# Patient Record
Sex: Male | Born: 2000
Health system: Southern US, Community
[De-identification: ages and names within clinical notes are randomized; demographics above are authoritative.]

## PROBLEM LIST (undated history)

## (undated) DIAGNOSIS — F909 Attention-deficit hyperactivity disorder, unspecified type: Secondary | ICD-10-CM

## (undated) DIAGNOSIS — J45909 Unspecified asthma, uncomplicated: Secondary | ICD-10-CM

## (undated) DIAGNOSIS — H7090 Unspecified mastoiditis, unspecified ear: Secondary | ICD-10-CM

## (undated) HISTORY — PX: TYMPANOSTOMY TUBE PLACEMENT: SHX32

## (undated) HISTORY — DX: Unspecified mastoiditis, unspecified ear: H70.90

---
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
## 2020-01-15 ENCOUNTER — Ambulatory Visit: Payer: Self-pay

## 2020-08-03 ENCOUNTER — Emergency Department (HOSPITAL_COMMUNITY)
Admission: EM | Admit: 2020-08-03 | Discharge: 2020-08-03 | Disposition: A | Discharge status: Home / Self Care | Payer: 59 | Attending: Emergency Medicine | Admitting: Emergency Medicine

## 2020-08-03 ENCOUNTER — Encounter (HOSPITAL_COMMUNITY): Payer: Self-pay | Admitting: Emergency Medicine

## 2020-08-03 ENCOUNTER — Other Ambulatory Visit: Payer: Self-pay

## 2020-08-03 ENCOUNTER — Emergency Department (HOSPITAL_COMMUNITY): Payer: 59

## 2020-08-03 DIAGNOSIS — R519 Headache, unspecified: Secondary | ICD-10-CM | POA: Diagnosis present

## 2020-08-03 DIAGNOSIS — J45909 Unspecified asthma, uncomplicated: Secondary | ICD-10-CM | POA: Diagnosis not present

## 2020-08-03 DIAGNOSIS — Z87891 Personal history of nicotine dependence: Secondary | ICD-10-CM | POA: Diagnosis not present

## 2020-08-03 MED ORDER — ACETAMINOPHEN 500 MG PO TABS
1000.0000 mg | ORAL_TABLET | Freq: Once | ORAL | Status: AC
  Administered 2020-08-03: 1000 mg via ORAL
  Filled 2020-08-03: qty 2

## 2020-08-03 MED ORDER — PROCHLORPERAZINE MALEATE 5 MG PO TABS
10.0000 mg | ORAL_TABLET | Freq: Four times a day (QID) | ORAL | Status: DC | PRN
  Administered 2020-08-03: 10 mg via ORAL
  Filled 2020-08-03: qty 2

## 2020-08-03 MED ORDER — CEFDINIR 300 MG PO CAPS: 300.0000 mg | ORAL_CAPSULE | Freq: Two times a day (BID) | ORAL | 0 refills | Status: AC

## 2020-08-03 NOTE — ED Triage Notes (Signed)
Pt states he was smoking weed with a friend when he felt something pop in his head. Pt states he can feel the blood running through his head and his hands. Pt states he has been in and out of the heat today. Pt very agitated. Demanding we get doctor in room ASAP. Keeps asking this nurse to "feel his head so we can feel the blood running through it".

## 2020-08-03 NOTE — Discharge Instructions (Addendum)
You were seen in the ER today for your headache.  Your physical exam, vital signs, and CT of the head were very reassuring.  You may use Tylenol and ibuprofen as needed for your headache.  Encourage rest for the next couple days.  Additionally regarding right ear pain you have been prescribed an antibiotic to treat your suspected ear infection.  Please take this as prescribed for the entire course.  Return to the ER if you develop any new blurry vision, double vision, nausea or vomiting does not stop, or any other new severe symptoms.
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
## 2020-12-19 ENCOUNTER — Emergency Department (HOSPITAL_COMMUNITY)
Admission: EM | Admit: 2020-12-19 | Discharge: 2020-12-20 | Disposition: A | Discharge status: Home / Self Care | Payer: 59 | Attending: Emergency Medicine | Admitting: Emergency Medicine

## 2020-12-19 ENCOUNTER — Encounter (HOSPITAL_COMMUNITY): Payer: Self-pay

## 2020-12-19 ENCOUNTER — Other Ambulatory Visit: Payer: Self-pay

## 2020-12-19 DIAGNOSIS — M79631 Pain in right forearm: Secondary | ICD-10-CM | POA: Diagnosis not present

## 2020-12-19 DIAGNOSIS — Z5321 Procedure and treatment not carried out due to patient leaving prior to being seen by health care provider: Secondary | ICD-10-CM | POA: Insufficient documentation

## 2020-12-19 DIAGNOSIS — Y92410 Unspecified street and highway as the place of occurrence of the external cause: Secondary | ICD-10-CM | POA: Diagnosis not present

## 2020-12-19 DIAGNOSIS — R519 Headache, unspecified: Secondary | ICD-10-CM | POA: Insufficient documentation
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
## 2020-12-30 ENCOUNTER — Other Ambulatory Visit: Payer: Self-pay

## 2020-12-30 ENCOUNTER — Ambulatory Visit
Admission: EM | Admit: 2020-12-30 | Discharge: 2020-12-30 | Disposition: A | Discharge status: Home / Self Care | Payer: 59 | Attending: Physician Assistant | Admitting: Physician Assistant

## 2020-12-30 DIAGNOSIS — J45901 Unspecified asthma with (acute) exacerbation: Secondary | ICD-10-CM | POA: Diagnosis not present

## 2020-12-30 DIAGNOSIS — Z20828 Contact with and (suspected) exposure to other viral communicable diseases: Secondary | ICD-10-CM | POA: Diagnosis not present

## 2020-12-30 DIAGNOSIS — J019 Acute sinusitis, unspecified: Secondary | ICD-10-CM

## 2020-12-30 MED ORDER — PREDNISONE 10 MG (21) PO TBPK: ORAL_TABLET | Freq: Every day | ORAL | 0 refills | Status: DC

## 2020-12-30 MED ORDER — ALBUTEROL SULFATE HFA 108 (90 BASE) MCG/ACT IN AERS: 1.0000 | INHALATION_SPRAY | Freq: Four times a day (QID) | RESPIRATORY_TRACT | 1 refills | Status: AC | PRN

## 2020-12-30 MED ORDER — DOXYCYCLINE HYCLATE 100 MG PO CAPS: 100.0000 mg | ORAL_CAPSULE | Freq: Two times a day (BID) | ORAL | 0 refills | Status: AC

## 2020-12-30 NOTE — ED Triage Notes (Signed)
Patient states that he is very congested for about 2 weeks. His mom and brother were diagnosed with Flu about a week ago.   He states he took Nyquil and Prednisone from an old prescription for the past 6 days.  He states his throat is hurting bad and coughing up a little blood and it os hard to breath because he asthma. He also states he has had bad headaches.  Denies Fever.

## 2020-12-30 NOTE — ED Provider Notes (Signed)
[facility]    CSN: [id number] Arrival date & time: 12/30/20  1136      History   Chief Complaint No chief complaint on file.   HPI Wayne Rogers is a 20 y.o. male.   Pt complains of cough, congestion, and sinus pressure that started about two weeks ago.  Complains of sore throat.  Denies fever, chills, n/v/d.  He has a h/o asthma and reports some increased wheezing and shortness of breath.  He is using his inhaler with temporary results.  He has been taking Nyquil with no relief.  He has been taking prednisone from an old prescription, unsure of dosage with minimal relief.    Past Medical History:  Diagnosis Date   ADHD (attention deficit hyperactivity disorder)    Asthma    Mastoiditis     Patient Active Problem List   Diagnosis Date Noted   Asthma, chronic 05/08/2012   Multiple food allergies 05/08/2012    Past Surgical History:  Procedure Laterality Date   IRRIGATION AND DEBRIDEMENT ABSCESS  02/02/2012   Procedure: IRRIGATION AND DEBRIDEMENT ABSCESS;  Surgeon: Melissa Montane, MD;  Location: [place];  Service: ENT;  Laterality: Right;   MYRINGOTOMY WITH TUBE PLACEMENT  02/02/2012   Procedure: MYRINGOTOMY WITH TUBE PLACEMENT;  Surgeon: Melissa Montane, MD;  Location: [place];  Service: ENT;  Laterality: Right;   TYMPANOSTOMY TUBE PLACEMENT         Home Medications    Prior to Admission medications   Medication Sig Start Date End Date Taking? Authorizing Provider  albuterol (VENTOLIN HFA) 108 (90 Base) MCG/ACT inhaler Inhale 1-2 puffs into the lungs every 6 (six) hours as needed for wheezing or shortness of breath. 12/30/20  Yes Ward, Lenise Arena, PA-C  doxycycline (VIBRAMYCIN) 100 MG capsule Take 1 capsule (100 mg total) by mouth 2 (two) times daily for 5 days. 12/30/20 01/04/21 Yes Ward, Lenise Arena, PA-C  predniSONE (STERAPRED UNI-PAK 21 TAB) 10 MG (21) TBPK tablet Take by mouth daily. Take 6 tabs by mouth daily  for 2 days, then 5 tabs for 2 days, then 4 tabs for 2 days,  then 3 tabs for 2 days, 2 tabs for 2 days, then 1 tab by mouth daily for 2 days 12/30/20  Yes Ward, Janett Billow Z, PA-C  albuterol (PROVENTIL) (2.5 MG/3ML) 0.083% nebulizer solution Take 3 mLs (2.5 mg total) by nebulization every 4 (four) hours as needed for wheezing or shortness of breath. 12/04/15   Valentina Shaggy, MD  albuterol (VENTOLIN HFA) 108 (90 Base) MCG/ACT inhaler INHALE 2 PUFFS EVERY 4 HOURS AS NEEDED FOR WHEEZING OR SHORTNESS OF BREATH. Patient taking differently: Inhale 2 puffs into the lungs every 4 (four) hours as needed for shortness of breath or wheezing. 10/16/17   Valentina Shaggy, MD  naproxen sodium (ALEVE) 220 MG tablet Take 220 mg by mouth daily as needed (for pain).    [provider]    Family History Family History  Problem Relation Age of Onset   Hypertension Father    Asthma Sister    Asthma Maternal Grandmother    Cancer Maternal Grandmother    Hypertension Maternal Grandfather    Heart disease Maternal Grandfather    Heart disease Paternal Grandfather    Allergic rhinitis Neg Hx    Angioedema Neg Hx    Atopy Neg Hx    Eczema Neg Hx    Immunodeficiency Neg Hx    Urticaria Neg Hx     Social History  Social History   Tobacco Use   Smoking status: Former    Packs/day: 0.50    Types: Cigarettes, E-cigarettes   Smokeless tobacco: Never   Tobacco comments:    mom smokes  Vaping Use   Vaping Use: Every day  Substance Use Topics   Alcohol use: No   Drug use: No     Allergies   Penicillins, Sulfa antibiotics, Other, and Shellfish allergy   Review of Systems Review of Systems  Constitutional:  Negative for chills and fever.  HENT:  Positive for congestion and sinus pressure. Negative for ear pain and sore throat.   Eyes:  Negative for pain and visual disturbance.  Respiratory:  Positive for cough, shortness of breath and wheezing.   Cardiovascular:  Negative for chest pain and palpitations.  Gastrointestinal:  Negative for  abdominal pain and vomiting.  Genitourinary:  Negative for dysuria and hematuria.  Musculoskeletal:  Negative for arthralgias and back pain.  Skin:  Negative for color change and rash.  Neurological:  Negative for seizures and syncope.  All other systems reviewed and are negative.   Physical Exam Triage Vital Signs ED Triage Vitals  Enc Vitals Group     BP 12/30/20 1245 112/74     Pulse Rate 12/30/20 1245 (!) 117     Resp 12/30/20 1245 20     Temp 12/30/20 1245 98.8 F (37.1 C)     Temp Source 12/30/20 1245 Oral     SpO2 12/30/20 1245 91 %     Weight --      Height --      Head Circumference --      Peak Flow --      Pain Score 12/30/20 1242 6     Pain Loc --      Pain Edu? --      Excl. in GC? --    No data found.  Updated Vital Signs BP 112/74 (BP Location: Right Arm)   Pulse (!) 117   Temp 98.8 F (37.1 C) (Oral)   Resp 20   SpO2 91%   Visual Acuity Right Eye Distance:   Left Eye Distance:   Bilateral Distance:    Right Eye Near:   Left Eye Near:    Bilateral Near:     Physical Exam Vitals and nursing note reviewed.  Constitutional:      General: He is not in acute distress.    Appearance: He is well-developed.  HENT:     Head: Normocephalic and atraumatic.  Eyes:     Conjunctiva/sclera: Conjunctivae normal.  Cardiovascular:     Rate and Rhythm: Normal rate and regular rhythm.     Heart sounds: No murmur heard. Pulmonary:     Effort: Pulmonary effort is normal. No respiratory distress.     Breath sounds: Normal breath sounds.  Abdominal:     Palpations: Abdomen is soft.     Tenderness: There is no abdominal tenderness.  Musculoskeletal:        General: No swelling.     Cervical back: Neck supple.  Skin:    General: Skin is warm and dry.     Capillary Refill: Capillary refill takes less than 2 seconds.  Neurological:     Mental Status: He is alert.  Psychiatric:        Mood and Affect: Mood normal.     UC Treatments / Results   Labs (all labs ordered are listed, but only abnormal results are displayed) Labs Reviewed  COVID-19,  FLU A+B NAA    EKG   Radiology No results found.  Procedures Procedures (including critical care time)  Medications Ordered in UC Medications - No data to display  Initial Impression / Assessment and Plan / UC Course  I have reviewed the triage vital signs and the nursing notes.  Pertinent labs & imaging results that were available during my care of the patient were reviewed by me and considered in my medical decision making (see chart for details).     Acute sinusitis, asthma exacerbation.  Antibiotic and prednisone prescribed.  He will continue with inhaler, asked for refill.  Supportive care discussed.  Return precautions discussed.  Final Clinical Impressions(s) / UC Diagnoses   Final diagnoses:  Exposure to the flu  Acute non-recurrent sinusitis, unspecified location  Mild asthma with acute exacerbation, unspecified whether persistent     Discharge Instructions      Take antibiotic as prescribed Take prednisone as prescribed Recommend Flonase and Mucinex Can take Delsym for cough as needed Return if symptoms become worse.      ED Prescriptions     Medication Sig Dispense Auth. Provider   doxycycline (VIBRAMYCIN) 100 MG capsule Take 1 capsule (100 mg total) by mouth 2 (two) times daily for 5 days. 10 capsule Ward, Lenise Arena, PA-C   predniSONE (STERAPRED UNI-PAK 21 TAB) 10 MG (21) TBPK tablet Take by mouth daily. Take 6 tabs by mouth daily  for 2 days, then 5 tabs for 2 days, then 4 tabs for 2 days, then 3 tabs for 2 days, 2 tabs for 2 days, then 1 tab by mouth daily for 2 days 42 tablet Ward, Lenise Arena, PA-C   albuterol (VENTOLIN HFA) 108 (90 Base) MCG/ACT inhaler Inhale 1-2 puffs into the lungs every 6 (six) hours as needed for wheezing or shortness of breath. 1 each Ward, Lenise Arena, PA-C      PDMP not reviewed this encounter.   Ward, Lenise Arena,  PA-C 12/30/20 1319

## 2020-12-30 NOTE — Discharge Instructions (Addendum)
Take antibiotic as prescribed Take prednisone as prescribed Recommend Flonase and Mucinex Can take Delsym for cough as needed Return if symptoms become worse.

## 2020-12-31 LAB — COVID-19, FLU A+B NAA
Influenza A, NAA: NOT DETECTED
Influenza B, NAA: NOT DETECTED
SARS-CoV-2, NAA: NOT DETECTED

## 2021-09-17 ENCOUNTER — Emergency Department (HOSPITAL_COMMUNITY)
Admission: EM | Admit: 2021-09-17 | Discharge: 2021-09-17 | Disposition: A | Discharge status: Home / Self Care | Payer: 59 | Attending: Emergency Medicine | Admitting: Emergency Medicine

## 2021-09-17 ENCOUNTER — Other Ambulatory Visit: Payer: Self-pay

## 2021-09-17 ENCOUNTER — Encounter (HOSPITAL_COMMUNITY): Payer: Self-pay

## 2021-09-17 ENCOUNTER — Emergency Department (HOSPITAL_COMMUNITY): Payer: 59

## 2021-09-17 DIAGNOSIS — S59911A Unspecified injury of right forearm, initial encounter: Secondary | ICD-10-CM | POA: Diagnosis not present

## 2021-09-17 DIAGNOSIS — J45909 Unspecified asthma, uncomplicated: Secondary | ICD-10-CM | POA: Diagnosis not present

## 2021-09-17 DIAGNOSIS — S51812A Laceration without foreign body of left forearm, initial encounter: Secondary | ICD-10-CM | POA: Diagnosis present

## 2021-09-17 DIAGNOSIS — W540XXA Bitten by dog, initial encounter: Secondary | ICD-10-CM | POA: Insufficient documentation

## 2021-09-17 DIAGNOSIS — S61412A Laceration without foreign body of left hand, initial encounter: Secondary | ICD-10-CM | POA: Insufficient documentation

## 2021-09-17 DIAGNOSIS — Z7951 Long term (current) use of inhaled steroids: Secondary | ICD-10-CM | POA: Insufficient documentation

## 2021-09-17 MED ORDER — OXYCODONE-ACETAMINOPHEN 5-325 MG PO TABS: 1.0000 | ORAL_TABLET | Freq: Three times a day (TID) | ORAL | 0 refills | Status: DC | PRN

## 2021-09-17 MED ORDER — DOXYCYCLINE HYCLATE 100 MG PO TABS
100.0000 mg | ORAL_TABLET | Freq: Once | ORAL | Status: AC
  Administered 2021-09-17: 100 mg via ORAL
  Filled 2021-09-17: qty 1

## 2021-09-17 MED ORDER — OXYCODONE-ACETAMINOPHEN 5-325 MG PO TABS
1.0000 | ORAL_TABLET | Freq: Once | ORAL | Status: AC
  Administered 2021-09-17: 1 via ORAL
  Filled 2021-09-17: qty 1

## 2021-09-17 MED ORDER — METRONIDAZOLE 500 MG PO TABS
500.0000 mg | ORAL_TABLET | Freq: Once | ORAL | Status: AC
  Administered 2021-09-17: 500 mg via ORAL
  Filled 2021-09-17: qty 1

## 2021-09-17 MED ORDER — DOXYCYCLINE HYCLATE 100 MG PO CAPS: 100.0000 mg | ORAL_CAPSULE | Freq: Two times a day (BID) | ORAL | 0 refills | Status: AC

## 2021-09-17 MED ORDER — METRONIDAZOLE 500 MG PO TABS: 500.0000 mg | ORAL_TABLET | Freq: Three times a day (TID) | ORAL | 0 refills | Status: AC

## 2021-09-17 NOTE — ED Notes (Signed)
Wounds to L hand and R forearm cleaned with sterile water.
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
## 2021-09-17 NOTE — ED Triage Notes (Signed)
Called x1 . Not present in lobby.  

## 2021-09-17 NOTE — ED Provider Notes (Signed)
[facility] EMERGENCY DEPARTMENT Provider Note   CSN: [id number] Arrival date & time: 09/17/21  1905     History {Add pertinent medical, surgical, social history, OB history to HPI:1} Chief Complaint  Patient presents with   Animal Bite    Wayne Rogers is a 21 y.o. male.   Animal Bite      Home Medications Prior to Admission medications   Medication Sig Start Date End Date Taking? Authorizing Provider  albuterol (PROVENTIL) (2.5 MG/3ML) 0.083% nebulizer solution Take 3 mLs (2.5 mg total) by nebulization every 4 (four) hours as needed for wheezing or shortness of breath. 12/04/15   Alfonse Spruce, MD  albuterol (VENTOLIN HFA) 108 (90 Base) MCG/ACT inhaler INHALE 2 PUFFS EVERY 4 HOURS AS NEEDED FOR WHEEZING OR SHORTNESS OF BREATH. Patient taking differently: Inhale 2 puffs into the lungs every 4 (four) hours as needed for shortness of breath or wheezing. 10/16/17   Alfonse Spruce, MD  albuterol (VENTOLIN HFA) 108 (90 Base) MCG/ACT inhaler Inhale 1-2 puffs into the lungs every 6 (six) hours as needed for wheezing or shortness of breath. 12/30/20   Ward, Tylene Fantasia, PA-C  naproxen sodium (ALEVE) 220 MG tablet Take 220 mg by mouth daily as needed (for pain).    [provider]  predniSONE (STERAPRED UNI-PAK 21 TAB) 10 MG (21) TBPK tablet Take by mouth daily. Take 6 tabs by mouth daily  for 2 days, then 5 tabs for 2 days, then 4 tabs for 2 days, then 3 tabs for 2 days, 2 tabs for 2 days, then 1 tab by mouth daily for 2 days 12/30/20   Ward, Tylene Fantasia, PA-C      Allergies    Penicillins, Sulfa antibiotics, Other, and Shellfish allergy    Review of Systems   Review of Systems  Physical Exam Updated Vital Signs BP 122/69 (BP Location: Right Arm)   Pulse (!) 111   Temp 98.5 F (36.9 C) (Oral)   Resp 17   Ht 5\' 6"  (1.676 m)   Wt 68 kg   SpO2 100%   BMI 24.21 kg/m  Physical Exam  ED Results / Procedures / Treatments   Labs (all labs ordered are listed,  but only abnormal results are displayed) Labs Reviewed - No data to display  EKG None  Radiology DG Forearm Right  Result Date: 09/17/2021 CLINICAL DATA:  Dog bite EXAM: RIGHT FOREARM - 2 VIEW COMPARISON:  None Available. FINDINGS: No acute fracture or dislocation. Remote healed distal radial fracture with residual dorsal tilt of the distal radial articular surface, not well profiled on this examination. Subcutaneous gas is seen within the volar soft tissues at the level of the mid diaphysis of the radius in keeping with given history of bite wound. No retained radiopaque foreign body. IMPRESSION: Soft tissue injury. No retained radiopaque foreign body. No acute fracture or dislocation. Electronically Signed   By: 09/19/2021 M.D.   On: 09/17/2021 22:09   DG Hand Complete Left  Result Date: 09/17/2021 CLINICAL DATA:  Left hand dog bite wound EXAM: LEFT HAND - COMPLETE 3+ VIEW COMPARISON:  None Available. FINDINGS: Normal alignment. No acute fracture or dislocation. Joint spaces are preserved. There is subcutaneous gas within the first and second webspaces surrounding the second metacarpal in keeping with given history of bite wound. No retained radiopaque foreign body. IMPRESSION: Soft tissue injury. No retained radiopaque foreign body. No fracture or dislocation. Electronically Signed   By: 09/19/2021.D.  On: 09/17/2021 22:07    Procedures Procedures  {Document cardiac monitor, telemetry assessment procedure when appropriate:1}  Medications Ordered in ED Medications  oxyCODONE-acetaminophen (PERCOCET/ROXICET) 5-325 MG per tablet 1 tablet (1 tablet Oral Given 09/17/21 2142)    ED Course/ Medical Decision Making/ A&P                           Medical Decision Making Amount and/or Complexity of Data Reviewed Radiology: ordered.  Risk Prescription drug management.   ***  {Document critical care time when appropriate:1} {Document review of labs and clinical decision tools  ie heart score, Chads2Vasc2 etc:1}  {Document your independent review of radiology images, and any outside records:1} {Document your discussion with family members, caretakers, and with consultants:1} {Document social determinants of health affecting pt's care:1} {Document your decision making why or why not admission, treatments were needed:1} Final Clinical Impression(s) / ED Diagnoses Final diagnoses:  None    Rx / DC Orders ED Discharge Orders     None

## 2021-09-17 NOTE — Discharge Instructions (Signed)
You came to the emergency department today to be evaluated after being bitten by a dog.  Your x-rays did not show any broken bones or dislocations.  Due to the dog bite I have started you on antibiotics.  Please take the antibiotic, Doxycycline, every 12 hours until gone. A side effect of this medication includes hypersensitivity to the suns rays - please take measures to protect your skin from the sun while taking this medication.  It is very important that you do not consume any alcohol while taking Flagyl (metronidazole) as it will cause you to become violently ill.   You may gently wash the area with soap and water.  Do not submerge the wound under water until the wound is healed.  Get help right away if: You have a red streak that leads away from your wound. You have non-clear fluid or more blood coming from your wound. There is pus or a bad smell coming from your wound. You have trouble moving your injured area. You have numbness or tingling that spreads beyond your wound.   

## 2021-09-18 ENCOUNTER — Telehealth (HOSPITAL_COMMUNITY): Payer: Self-pay | Admitting: Emergency Medicine

## 2021-09-18 MED ORDER — OXYCODONE-ACETAMINOPHEN 5-325 MG PO TABS: 1.0000 | ORAL_TABLET | Freq: Four times a day (QID) | ORAL | 0 refills | Status: DC | PRN

## 2021-09-18 MED ORDER — OXYCODONE-ACETAMINOPHEN 5-325 MG PO TABS: 1.0000 | ORAL_TABLET | Freq: Three times a day (TID) | ORAL | 0 refills | Status: AC | PRN

## 2021-09-18 MED ORDER — OXYCODONE-ACETAMINOPHEN 5-325 MG PO TABS: 1.0000 | ORAL_TABLET | Freq: Three times a day (TID) | ORAL | 0 refills | Status: DC | PRN

## 2021-09-18 NOTE — Telephone Encounter (Signed)
I was contacted by charge nurse Bonita Quin who spoke to the patient.  Patient was unable to pick up his prescription of Percocet from the pharmacy that it was sent to yesterday.  Charge nurse Bonita Quin spoke to other pharmacy and canceled that prescription.  I will send prescription for Percocet to correct pharmacy.  Additionally patient was contacted to follow-up with PCP urgent care to receive tetanus shot.

## 2021-09-18 NOTE — ED Notes (Addendum)
Medication changed to [facility] in [place] out of stock. Mason Jim informed other script was discontinued and pt should get a tetanus shot form PCP. 09/18/21 1415

## 2021-09-18 NOTE — Telephone Encounter (Signed)
Correction made to pharmacy that patient's medication was sent to.

## 2021-09-18 NOTE — Telephone Encounter (Signed)
Pt called requesting that his percocet be sent to a different pharmacy since the original pharmacy was out. Based on PDMP review today he did not fill that rx for percocet. Discussed with PA who evaluated the patient yesterday and they will send his rx to the requested pharmacy.

## 2021-09-18 NOTE — ED Notes (Signed)
09/18/21 1545 T. Messiyah Waterson RN called to [facility] [place] to cancel d/c prescription of Percoet per patient mother request due to insurance. Rn spoke with patient who also requested prescription be cancelled at [facility]. EDPA Parke Poisson to [place] prescription into [facility] on [address] per patient request.

## 2021-09-18 NOTE — Telephone Encounter (Signed)
Patient's mother called to say that they need to the pharmacy change for the Percocet due to a problem with their insurance.  Patient's mother requesting that prescription go to [facility] on [address].  Pharmacy at previous prescription was sent to was called to cancel prescription.  Prescription for new medication was sent to correct pharmacy.

## 2021-09-18 NOTE — Telephone Encounter (Deleted)
Patient called and reports that the pharmacy does not have their prescription of Percocet and is requesting that it be sent to another pharmacy.  Family requested to be sent to another pharmacy.  After reviewing PDMP does not appear that the patient has filled his prior prescriptions so well send it to their pharmacy.

## 2021-09-18 NOTE — Telephone Encounter (Signed)
Last 3 prescriptions were accidentally sent to [facility] pharmacy.  I spoke with [facility] pharmacist to cancel all prescriptions.  Prescription was sent to [facility] on [address].

## 2021-10-16 ENCOUNTER — Ambulatory Visit
Admission: EM | Admit: 2021-10-16 | Discharge: 2021-10-16 | Disposition: A | Discharge status: Home / Self Care | Payer: 59 | Attending: Family Medicine | Admitting: Family Medicine

## 2021-10-16 ENCOUNTER — Other Ambulatory Visit: Payer: Self-pay

## 2021-10-16 DIAGNOSIS — R509 Fever, unspecified: Secondary | ICD-10-CM | POA: Diagnosis not present

## 2021-10-16 DIAGNOSIS — R52 Pain, unspecified: Secondary | ICD-10-CM | POA: Insufficient documentation

## 2021-10-16 DIAGNOSIS — R112 Nausea with vomiting, unspecified: Secondary | ICD-10-CM | POA: Insufficient documentation

## 2021-10-16 DIAGNOSIS — J039 Acute tonsillitis, unspecified: Secondary | ICD-10-CM | POA: Diagnosis present

## 2021-10-16 DIAGNOSIS — Z1152 Encounter for screening for COVID-19: Secondary | ICD-10-CM | POA: Insufficient documentation

## 2021-10-16 LAB — POCT RAPID STREP A (OFFICE): Rapid Strep A Screen: NEGATIVE

## 2021-10-16 LAB — RESP PANEL BY RT-PCR (FLU A&B, COVID) ARPGX2
Influenza A by PCR: NEGATIVE
Influenza B by PCR: NEGATIVE
SARS Coronavirus 2 by RT PCR: NEGATIVE

## 2021-10-16 LAB — POCT MONO SCREEN (KUC): Mono, POC: NEGATIVE

## 2021-10-16 MED ORDER — ONDANSETRON 4 MG PO TBDP: 4.0000 mg | ORAL_TABLET | Freq: Three times a day (TID) | ORAL | 0 refills | Status: AC | PRN

## 2021-10-16 NOTE — ED Provider Notes (Signed)
[facility]    CSN: [id number] Arrival date & time: 10/16/21  1912      History   Chief Complaint Chief Complaint  Patient presents with   Fever    HPI Wayne Rogers is a 21 y.o. male.   Patient presenting today with 2-day history of fever, chills, body aches, nausea, vomiting, sore swollen feeling throat, mild nasal congestion, fatigue.  Denies cough, chest pain, shortness of breath, abdominal pain, diarrhea.  So far trying ibuprofen and Tylenol with minimal relief.  No known sick contacts recently.  History of asthma and allergies on prn medications for both.   Past Medical History:  Diagnosis Date   ADHD (attention deficit hyperactivity disorder)    Asthma    Mastoiditis     Patient Active Problem List   Diagnosis Date Noted   Asthma, chronic 05/08/2012   Multiple food allergies 05/08/2012    Past Surgical History:  Procedure Laterality Date   IRRIGATION AND DEBRIDEMENT ABSCESS  02/02/2012   Procedure: IRRIGATION AND DEBRIDEMENT ABSCESS;  Surgeon: Melissa Montane, MD;  Location: [place];  Service: ENT;  Laterality: Right;   MYRINGOTOMY WITH TUBE PLACEMENT  02/02/2012   Procedure: MYRINGOTOMY WITH TUBE PLACEMENT;  Surgeon: Melissa Montane, MD;  Location: [place];  Service: ENT;  Laterality: Right;   TYMPANOSTOMY TUBE PLACEMENT         Home Medications    Prior to Admission medications   Medication Sig Start Date End Date Taking? Authorizing Provider  ondansetron (ZOFRAN-ODT) 4 MG disintegrating tablet Take 1 tablet (4 mg total) by mouth every 8 (eight) hours as needed for nausea or vomiting. 10/16/21  Yes Volney American, PA-C  albuterol (PROVENTIL) (2.5 MG/3ML) 0.083% nebulizer solution Take 3 mLs (2.5 mg total) by nebulization every 4 (four) hours as needed for wheezing or shortness of breath. 12/04/15   Valentina Shaggy, MD  albuterol (VENTOLIN HFA) 108 (90 Base) MCG/ACT inhaler INHALE 2 PUFFS EVERY 4 HOURS AS NEEDED FOR WHEEZING OR SHORTNESS OF  BREATH. Patient taking differently: Inhale 2 puffs into the lungs every 4 (four) hours as needed for shortness of breath or wheezing. 10/16/17   Valentina Shaggy, MD  albuterol (VENTOLIN HFA) 108 (90 Base) MCG/ACT inhaler Inhale 1-2 puffs into the lungs every 6 (six) hours as needed for wheezing or shortness of breath. 12/30/20   Ward, Lenise Arena, PA-C  naproxen sodium (ALEVE) 220 MG tablet Take 220 mg by mouth daily as needed (for pain).    [provider]  predniSONE (STERAPRED UNI-PAK 21 TAB) 10 MG (21) TBPK tablet Take by mouth daily. Take 6 tabs by mouth daily  for 2 days, then 5 tabs for 2 days, then 4 tabs for 2 days, then 3 tabs for 2 days, 2 tabs for 2 days, then 1 tab by mouth daily for 2 days 12/30/20   Ward, Lenise Arena, PA-C    Family History Family History  Problem Relation Age of Onset   Hypertension Father    Asthma Sister    Asthma Maternal Grandmother    Cancer Maternal Grandmother    Hypertension Maternal Grandfather    Heart disease Maternal Grandfather    Heart disease Paternal Grandfather    Allergic rhinitis Neg Hx    Angioedema Neg Hx    Atopy Neg Hx    Eczema Neg Hx    Immunodeficiency Neg Hx    Urticaria Neg Hx     Social History Social History   Tobacco Use  Smoking status: Former    Packs/day: 0.50    Types: Cigarettes, E-cigarettes   Smokeless tobacco: Never   Tobacco comments:    mom smokes  Vaping Use   Vaping Use: Every day  Substance Use Topics   Alcohol use: No   Drug use: No     Allergies   Penicillins, Sulfa antibiotics, Other, and Shellfish allergy   Review of Systems Review of Systems PER HPI  Physical Exam Triage Vital Signs ED Triage Vitals [10/16/21 1923]  Enc Vitals Group     BP 119/78     Pulse Rate (!) 113     Resp 20     Temp 98.6 F (37 C)     Temp Source Oral     SpO2 97 %     Weight      Height      Head Circumference      Peak Flow      Pain Score 8     Pain Loc      Pain Edu?      Excl. in  Learned?    No data found.  Updated Vital Signs BP 119/78 (BP Location: Right Arm)   Pulse (!) 113   Temp 98.6 F (37 C) (Oral)   Resp 20   SpO2 97%   Visual Acuity Right Eye Distance:   Left Eye Distance:   Bilateral Distance:    Right Eye Near:   Left Eye Near:    Bilateral Near:     Physical Exam Vitals and nursing note reviewed.  Constitutional:      Appearance: He is well-developed.  HENT:     Head: Atraumatic.     Right Ear: External ear normal.     Left Ear: External ear normal.     Nose: Nose normal.     Mouth/Throat:     Pharynx: Oropharyngeal exudate and posterior oropharyngeal erythema present.     Comments: Bilateral tonsillar erythema, edema, exudates.  Uvula midline, otherwise patent Eyes:     Conjunctiva/sclera: Conjunctivae normal.     Pupils: Pupils are equal, round, and reactive to light.  Cardiovascular:     Rate and Rhythm: Normal rate and regular rhythm.     Heart sounds: Normal heart sounds.  Pulmonary:     Effort: Pulmonary effort is normal. No respiratory distress.     Breath sounds: No wheezing or rales.  Musculoskeletal:        General: Normal range of motion.     Cervical back: Normal range of motion and neck supple.  Lymphadenopathy:     Cervical: Cervical adenopathy present.  Skin:    General: Skin is warm and dry.  Neurological:     Mental Status: He is alert and oriented to person, place, and time.  Psychiatric:        Behavior: Behavior normal.      UC Treatments / Results  Labs (all labs ordered are listed, but only abnormal results are displayed) Labs Reviewed  RESP PANEL BY RT-PCR (FLU A&B, COVID) [id number]  CULTURE, GROUP A STREP [facility])  POCT RAPID STREP A (OFFICE)  POCT MONO SCREEN ([facility])    EKG   Radiology No results found.  Procedures Procedures (including critical care time)  Medications Ordered in UC Medications - No data to display  Initial Impression / Assessment and Plan / UC Course  I have reviewed the  triage vital signs and the nursing notes.  Pertinent labs & imaging results that were available  during my care of the patient were reviewed by me and considered in my medical decision making (see chart for details).     Tachycardic in triage, otherwise vital signs reassuring.  Rapid strep negative, rapid mono negative, throat culture and respiratory panel pending for further rule out.  Treat with Zofran, cold and congestion medications and pain and fever reducers as needed.  Supportive care and return precautions reviewed.  Work note given.  Final Clinical Impressions(s) / UC Diagnoses   Final diagnoses:  Encounter for screening for COVID-19  Fever, unspecified  Generalized body aches  Nausea and vomiting, unspecified vomiting type  Acute tonsillitis, unspecified etiology   Discharge Instructions   None    ED Prescriptions     Medication Sig Dispense Auth. Provider   ondansetron (ZOFRAN-ODT) 4 MG disintegrating tablet Take 1 tablet (4 mg total) by mouth every 8 (eight) hours as needed for nausea or vomiting. 20 tablet Volney American, [place]      PDMP not reviewed this encounter.   Volney American, [place] 10/16/21 1959

## 2021-10-16 NOTE — ED Triage Notes (Signed)
Pt reports fever, body aches, emesis since Sunday. Last dose of ibuprofen/tylenol 10am this am.

## 2021-10-20 LAB — CULTURE, GROUP A STREP (THRC)

## 2022-03-14 ENCOUNTER — Ambulatory Visit
Admission: EM | Admit: 2022-03-14 | Discharge: 2022-03-14 | Disposition: A | Discharge status: Home / Self Care | Payer: Self-pay | Attending: Nurse Practitioner | Admitting: Nurse Practitioner

## 2022-03-14 DIAGNOSIS — Z1152 Encounter for screening for COVID-19: Secondary | ICD-10-CM | POA: Insufficient documentation

## 2022-03-14 DIAGNOSIS — J029 Acute pharyngitis, unspecified: Secondary | ICD-10-CM | POA: Insufficient documentation

## 2022-03-14 DIAGNOSIS — R509 Fever, unspecified: Secondary | ICD-10-CM | POA: Insufficient documentation

## 2022-03-14 LAB — POCT RAPID STREP A (OFFICE): Rapid Strep A Screen: NEGATIVE

## 2022-03-14 LAB — POCT INFLUENZA A/B
Influenza A, POC: NEGATIVE
Influenza B, POC: NEGATIVE

## 2022-03-14 NOTE — ED Triage Notes (Signed)
Pt needs a covid test for work. No sxs presented

## 2022-03-14 NOTE — Discharge Instructions (Addendum)
Your Influenza and Strep Test are all negative.  Your COVID is pending. Your results will show in your MyChart. Any positive results will result in a phone [place] from a nurse with next steps in treatment and recommendations.   You may have a viral illness. We encourage conservative treatment with symptom relief. We encourage you to use Tylenol alternating with Ibuprofen for your fever if not contraindicated. (Remember to use as directed do not exceed daily dosing recommendations) We also encourage salt water gargles for your sore throat. You should also consider throat lozenges and chloraseptic spray.  Your cough can be soothed with a cough suppressant.

## 2022-03-14 NOTE — ED Provider Notes (Signed)
[facility]    CSN: [id number] Arrival date & time: 03/14/22  1611      History   Chief Complaint No chief complaint on file.   HPI Wayne Rogers is a 22 y.o. male.   HPI   He reports fever and chills on last night with a sore throat. He has PMH of Asthma and admits to having some problems with breathing this morning. He has chest pain but this has been going on for several months related to stress. He did use his inhaler. He reports that his job is requiring a [place]. Denies headache, dizziness, visual changes, nausea, vomiting or any edema.   Past Medical History:  Diagnosis Date   ADHD (attention deficit hyperactivity disorder)    Asthma    Mastoiditis     Patient Active Problem List   Diagnosis Date Noted   Asthma, chronic 05/08/2012   Multiple food allergies 05/08/2012    Past Surgical History:  Procedure Laterality Date   IRRIGATION AND DEBRIDEMENT ABSCESS  02/02/2012   Procedure: IRRIGATION AND DEBRIDEMENT ABSCESS;  Surgeon: Melissa Montane, MD;  Location: [place];  Service: ENT;  Laterality: Right;   MYRINGOTOMY WITH TUBE PLACEMENT  02/02/2012   Procedure: MYRINGOTOMY WITH TUBE PLACEMENT;  Surgeon: Melissa Montane, MD;  Location: [place];  Service: ENT;  Laterality: Right;   TYMPANOSTOMY TUBE PLACEMENT         Home Medications    Prior to Admission medications   Medication Sig Start Date End Date Taking? Authorizing Provider  albuterol (PROVENTIL) (2.5 MG/3ML) 0.083% nebulizer solution Take 3 mLs (2.5 mg total) by nebulization every 4 (four) hours as needed for wheezing or shortness of breath. 12/04/15   Valentina Shaggy, MD  albuterol (VENTOLIN HFA) 108 (90 Base) MCG/ACT inhaler INHALE 2 PUFFS EVERY 4 HOURS AS NEEDED FOR WHEEZING OR SHORTNESS OF BREATH. Patient taking differently: Inhale 2 puffs into the lungs every 4 (four) hours as needed for shortness of breath or wheezing. 10/16/17   Valentina Shaggy, MD  albuterol (VENTOLIN HFA) 108 (90 Base)  MCG/ACT inhaler Inhale 1-2 puffs into the lungs every 6 (six) hours as needed for wheezing or shortness of breath. 12/30/20   Ward, Lenise Arena, PA-C  ondansetron (ZOFRAN-ODT) 4 MG disintegrating tablet Take 1 tablet (4 mg total) by mouth every 8 (eight) hours as needed for nausea or vomiting. 10/16/21   Volney American, PA-C    Family History Family History  Problem Relation Age of Onset   Hypertension Father    Asthma Sister    Asthma Maternal Grandmother    Cancer Maternal Grandmother    Hypertension Maternal Grandfather    Heart disease Maternal Grandfather    Heart disease Paternal Grandfather    Allergic rhinitis Neg Hx    Angioedema Neg Hx    Atopy Neg Hx    Eczema Neg Hx    Immunodeficiency Neg Hx    Urticaria Neg Hx     Social History Social History   Tobacco Use   Smoking status: Former    Packs/day: 0.50    Types: Cigarettes, E-cigarettes   Smokeless tobacco: Never   Tobacco comments:    mom smokes  Vaping Use   Vaping Use: Every day  Substance Use Topics   Alcohol use: No   Drug use: No     Allergies   Penicillins, Sulfa antibiotics, Amoxicillin, Other, and Shellfish allergy   Review of Systems Review of Systems   Physical Exam Triage  Vital Signs ED Triage Vitals  Enc Vitals Group     BP      Pulse      Resp      Temp      Temp src      SpO2      Weight      Height      Head Circumference      Peak Flow      Pain Score      Pain Loc      Pain Edu?      Excl. in [place]?    No data found.  Updated Vital Signs BP 130/78 (BP Location: Right Arm)   Pulse 84   Temp 97.9 F (36.6 C) (Oral)   Resp 20   SpO2 97%   Visual Acuity Right Eye Distance:   Left Eye Distance:   Bilateral Distance:    Right Eye Near:   Left Eye Near:    Bilateral Near:     Physical Exam HENT:     Head: Normocephalic and atraumatic.     Right Ear: Tympanic membrane normal.     Left Ear: Tympanic membrane normal.     Nose: Nose normal.      Mouth/Throat:     Mouth: Mucous membranes are moist.     Pharynx: Posterior oropharyngeal erythema present.  Eyes:     Pupils: Pupils are equal, round, and reactive to light.  Cardiovascular:     Rate and Rhythm: Normal rate and regular rhythm.     Pulses: Normal pulses.  Pulmonary:     Breath sounds: Wheezing (expiratory) present.  Musculoskeletal:        General: Normal range of motion.     Cervical back: Normal range of motion.  Skin:    General: Skin is warm and dry.     Capillary Refill: Capillary refill takes less than 2 seconds.  Neurological:     General: No focal deficit present.     Mental Status: He is alert.  Psychiatric:        Mood and Affect: Mood normal.        Behavior: Behavior normal.      UC Treatments / Results  Labs (all labs ordered are listed, but only abnormal results are displayed) Labs Reviewed  SARS CORONAVIRUS 2 (TAT 6-24 HRS)  POCT RAPID STREP A (OFFICE)  POCT INFLUENZA A/B    EKG   Radiology No results found.  Procedures Procedures (including critical care time)  Medications Ordered in UC Medications - No data to display  Initial Impression / Assessment and Plan / UC Course  I have reviewed the triage vital signs and the nursing notes.  Pertinent labs & imaging results that were available during my care of the patient were reviewed by me and considered in my medical decision making (see chart for details).     Sore throat Final Clinical Impressions(s) / UC Diagnoses   Final diagnoses:  Sore throat  Fever, unspecified fever cause     Discharge Instructions      Your Influenza and Strep Test are all negative.  Your COVID is pending. Your results will show in your MyChart. Any positive results will result in a phone [place] from a nurse with next steps in treatment and recommendations.   You may have a viral illness. We encourage conservative treatment with symptom relief. We encourage you to use Tylenol alternating with  Ibuprofen for your fever if not contraindicated. (Remember to use  as directed do not exceed daily dosing recommendations) We also encourage salt water gargles for your sore throat. You should also consider throat lozenges and chloraseptic spray.  Your cough can be soothed with a cough suppressant.      ED Prescriptions   None    PDMP not reviewed this encounter.   Dionisio David [place], [place] 03/15/22 1034

## 2022-03-15 LAB — SARS CORONAVIRUS 2 (TAT 6-24 HRS): SARS Coronavirus 2: NEGATIVE

## 2022-03-20 ENCOUNTER — Ambulatory Visit (HOSPITAL_COMMUNITY): Payer: 59 | Admitting: Psychiatry

## 2022-04-09 ENCOUNTER — Ambulatory Visit (INDEPENDENT_AMBULATORY_CARE_PROVIDER_SITE_OTHER): Payer: 59

## 2022-04-09 ENCOUNTER — Ambulatory Visit
Admission: EM | Admit: 2022-04-09 | Discharge: 2022-04-09 | Disposition: A | Discharge status: Home / Self Care | Payer: 59 | Attending: Internal Medicine | Admitting: Internal Medicine

## 2022-04-09 DIAGNOSIS — M940 Chondrocostal junction syndrome [Tietze]: Secondary | ICD-10-CM | POA: Diagnosis present

## 2022-04-09 DIAGNOSIS — J029 Acute pharyngitis, unspecified: Secondary | ICD-10-CM | POA: Diagnosis present

## 2022-04-09 DIAGNOSIS — Z1152 Encounter for screening for COVID-19: Secondary | ICD-10-CM | POA: Diagnosis not present

## 2022-04-09 DIAGNOSIS — R051 Acute cough: Secondary | ICD-10-CM | POA: Diagnosis present

## 2022-04-09 DIAGNOSIS — R059 Cough, unspecified: Secondary | ICD-10-CM

## 2022-04-09 DIAGNOSIS — R079 Chest pain, unspecified: Secondary | ICD-10-CM | POA: Diagnosis not present

## 2022-04-09 LAB — POCT RAPID STREP A (OFFICE): Rapid Strep A Screen: NEGATIVE

## 2022-04-09 NOTE — ED Provider Notes (Addendum)
[facility]    CSN: [id number] Arrival date & time: 04/09/22  1012      History   Chief Complaint Chief Complaint  Patient presents with   Sore Throat   Nausea    HPI Wayne Rogers is a 22 y.o. male presents with HA, ST and nausea x 3 days. Has also had a cough and nose congestion and post nasal drainage. Denies feeling like he has had a fever. His employer is requiring to have a negative covid test in order to return  2- substernal chest pain and L upper chest pain off and on x 1 month. Provoked with arm movements and thorax movement. Is not hurting right now. He admits he does vape and has hx of asthma.     Past Medical History:  Diagnosis Date   ADHD (attention deficit hyperactivity disorder)    Asthma    Mastoiditis     Patient Active Problem List   Diagnosis Date Noted   Asthma, chronic 05/08/2012   Multiple food allergies 05/08/2012    Past Surgical History:  Procedure Laterality Date   IRRIGATION AND DEBRIDEMENT ABSCESS  02/02/2012   Procedure: IRRIGATION AND DEBRIDEMENT ABSCESS;  Surgeon: Melissa Montane, MD;  Location: [place];  Service: ENT;  Laterality: Right;   MYRINGOTOMY WITH TUBE PLACEMENT  02/02/2012   Procedure: MYRINGOTOMY WITH TUBE PLACEMENT;  Surgeon: Melissa Montane, MD;  Location: [place];  Service: ENT;  Laterality: Right;   TYMPANOSTOMY TUBE PLACEMENT         Home Medications    Prior to Admission medications   Medication Sig Start Date End Date Taking? Authorizing Provider  albuterol (PROVENTIL) (2.5 MG/3ML) 0.083% nebulizer solution Take 3 mLs (2.5 mg total) by nebulization every 4 (four) hours as needed for wheezing or shortness of breath. 12/04/15   Valentina Shaggy, MD  albuterol (VENTOLIN HFA) 108 (90 Base) MCG/ACT inhaler INHALE 2 PUFFS EVERY 4 HOURS AS NEEDED FOR WHEEZING OR SHORTNESS OF BREATH. Patient taking differently: Inhale 2 puffs into the lungs every 4 (four) hours as needed for shortness of breath or wheezing. 10/16/17    Valentina Shaggy, MD  albuterol (VENTOLIN HFA) 108 (90 Base) MCG/ACT inhaler Inhale 1-2 puffs into the lungs every 6 (six) hours as needed for wheezing or shortness of breath. 12/30/20   Ward, Lenise Arena, PA-C  ondansetron (ZOFRAN-ODT) 4 MG disintegrating tablet Take 1 tablet (4 mg total) by mouth every 8 (eight) hours as needed for nausea or vomiting. 10/16/21   Volney American, PA-C    Family History Family History  Problem Relation Age of Onset   Hypertension Father    Asthma Sister    Asthma Maternal Grandmother    Cancer Maternal Grandmother    Hypertension Maternal Grandfather    Heart disease Maternal Grandfather    Heart disease Paternal Grandfather    Allergic rhinitis Neg Hx    Angioedema Neg Hx    Atopy Neg Hx    Eczema Neg Hx    Immunodeficiency Neg Hx    Urticaria Neg Hx     Social History Social History   Tobacco Use   Smoking status: Former    Packs/day: 0.50    Types: Cigarettes, E-cigarettes   Smokeless tobacco: Never   Tobacco comments:    mom smokes  Vaping Use   Vaping Use: Every day  Substance Use Topics   Alcohol use: No   Drug use: No     Allergies   Penicillins,  Sulfa antibiotics, Amoxicillin, Other, and Shellfish allergy   Review of Systems Review of Systems  Constitutional:  Negative for activity change, appetite change, chills, diaphoresis, fatigue and fever.  HENT:  Positive for congestion, postnasal drip and sore throat. Negative for ear discharge, ear pain, rhinorrhea and trouble swallowing.   Respiratory:  Positive for cough. Negative for chest tightness, shortness of breath and wheezing.   Cardiovascular:  Positive for chest pain. Negative for palpitations.  Hematological:  Negative for adenopathy.     Physical Exam Triage Vital Signs ED Triage Vitals  Enc Vitals Group     BP 04/09/22 1148 132/77     Pulse Rate 04/09/22 1146 63     Resp 04/09/22 1146 17     Temp 04/09/22 1146 (!) 97.5 F (36.4 C)     Temp Source  04/09/22 1146 Oral     SpO2 04/09/22 1146 97 %     Weight --      Height --      Head Circumference --      Peak Flow --      Pain Score 04/09/22 1148 4     Pain Loc --      Pain Edu? --      Excl. in [place]? --    No data found.  Updated Vital Signs BP 132/77 (BP Location: Right Arm)   Pulse 63   Temp (!) 97.5 F (36.4 C) (Oral)   Resp 17   SpO2 97%   Visual Acuity Right Eye Distance:   Left Eye Distance:   Bilateral Distance:    Right Eye Near:   Left Eye Near:    Bilateral Near:      Physical Exam Vitals signs and nursing note reviewed.  Constitutional:      General: he is not in acute distress.    Appearance: Normal appearance. He is not ill-appearing, toxic-appearing or diaphoretic.  HENT:     Head: Normocephalic.     Right Ear: Tympanic membrane, ear canal and external ear normal.     Left Ear: Tympanic membrane, ear canal and external ear normal.     Nose: Nose normal.     Mouth/Throat: moderate erythema, with no exudate    Mouth: Mucous membranes are moist.  Eyes:     General: No scleral icterus.       Right eye: No discharge.        Left eye: No discharge.     Conjunctiva/sclera: Conjunctivae normal.  Neck:     Musculoskeletal: Neck supple. No neck rigidity.  Cardiovascular:     Rate and Rhythm: Normal rate and regular rhythm.     Heart sounds: No murmur.  Pulmonary:     Effort: Pulmonary effort is normal.     Breath sounds: Normal breath sounds. His central substernal area is tender whch reproduced his pain.  Abdominal:     General: Bowel sounds are normal. There is no distension.     Palpations: Abdomen is soft. There is no mass.     Tenderness: There is no abdominal tenderness. There is no guarding or rebound.     Hernia: No hernia is present.  Musculoskeletal: Normal range of motion.  Lymphadenopathy:     Cervical: No cervical adenopathy.  Skin:    General: Skin is warm and dry.     Coloration: Skin is not jaundiced.     Findings: No rash.   Neurological:     Mental Status: he is alert and oriented to  person, place, and time.     Gait: Gait normal.  Psychiatric:        Mood and Affect: Mood normal.        Behavior: Behavior normal.        Thought Content: Thought content normal.        Judgment: Judgment normal.    UC Treatments / Results  Labs (all labs ordered are listed, but only abnormal results are displayed) Labs Reviewed  SARS CORONAVIRUS 2 (TAT 6-24 HRS)  CULTURE, GROUP A STREP [facility])  POCT RAPID STREP A (OFFICE)  Rapid strep is negative  EKG   Radiology DG Chest 2 View  Result Date: 04/09/2022 CLINICAL DATA:  Provided history: Left-sided chest pain. Cough. History of asthma. Vaping history. EXAM: CHEST - 2 VIEW COMPARISON:  Chest CT 05/23/2016. Prior chest radiographs 12/05/2014 and earlier. FINDINGS: Heart size within normal limits. No appreciable airspace consolidation. No evidence of pleural effusion or pneumothorax. No acute osseous abnormality identified. Levocurvature of the mid-to-lower thoracic and visualized upper lumbar spine. IMPRESSION: No evidence of active cardiopulmonary disease. Electronically Signed   By: Kellie Simmering D.O.   On: 04/09/2022 12:20    Procedures Procedures (including critical care time)  Medications Ordered in UC Medications - No data to display  Initial Impression / Assessment and Plan / UC Course  I have reviewed the triage vital signs and the nursing notes.  Pertinent labs & imaging results that were available during my care of the patient were reviewed by me and considered in my medical decision making (see chart for details).  URI Costochondritis Pharyngitis   I ordered a covid test and he will check his mychart for results and print out if negative to take it to his work. Throat culture was ordered See instructions.    Final Clinical Impressions(s) / UC Diagnoses   Final diagnoses:  Acute pharyngitis, unspecified etiology  Acute cough  Costochondritis      Discharge Instructions      Your chest xray is normal The strep test is negative, but we are sending out the second swab for a culture and we will [place] you if positive.  The chest pain you have seems more muscular since it is provoked with arm and chest movement, and I have information for you to read about. You may take Ibuprofen up to 800 mg three times a day for 5-7 days when you have those flairs.  You may take Dayquil for your current symptoms as needed.  Work on quit vaping     ED Prescriptions   None    PDMP not reviewed this encounter.   Shelby Mattocks, PA-C 04/09/22 [place], [place], [place] 04/09/22 1520

## 2022-04-09 NOTE — Discharge Instructions (Addendum)
Your chest xray is normal The strep test is negative, but we are sending out the second swab for a culture and we will [place] you if positive.  The chest pain you have seems more muscular since it is provoked with arm and chest movement, and I have information for you to read about. You may take Ibuprofen up to 800 mg three times a day for 5-7 days when you have those flairs.  You may take Dayquil for your current symptoms as needed.  Work on quit vaping

## 2022-04-09 NOTE — ED Triage Notes (Signed)
Pt presents with c/o sore throat and nausea X 3 days.   States he has had headaches. Pt states he has to have a negative covid test to go back to work.

## 2022-04-10 LAB — SARS CORONAVIRUS 2 (TAT 6-24 HRS): SARS Coronavirus 2: NEGATIVE

## 2022-04-13 LAB — CULTURE, GROUP A STREP (THRC)

## 2022-09-07 ENCOUNTER — Other Ambulatory Visit: Payer: Self-pay

## 2022-09-07 ENCOUNTER — Emergency Department (HOSPITAL_COMMUNITY)
Admission: EM | Admit: 2022-09-07 | Discharge: 2022-09-07 | Disposition: A | Discharge status: Home / Self Care | Payer: 59 | Attending: Emergency Medicine | Admitting: Emergency Medicine

## 2022-09-07 ENCOUNTER — Encounter (HOSPITAL_COMMUNITY): Payer: Self-pay | Admitting: Emergency Medicine

## 2022-09-07 DIAGNOSIS — H6091 Unspecified otitis externa, right ear: Secondary | ICD-10-CM | POA: Diagnosis not present

## 2022-09-07 DIAGNOSIS — H9201 Otalgia, right ear: Secondary | ICD-10-CM | POA: Diagnosis present

## 2022-09-07 DIAGNOSIS — H60501 Unspecified acute noninfective otitis externa, right ear: Secondary | ICD-10-CM

## 2022-09-07 MED ORDER — CEPHALEXIN 500 MG PO CAPS
1000.0000 mg | ORAL_CAPSULE | Freq: Once | ORAL | Status: AC
  Administered 2022-09-07: 1000 mg via ORAL
  Filled 2022-09-07: qty 2

## 2022-09-07 MED ORDER — OFLOXACIN 0.3 % OT SOLN: 5.0000 [drp] | Freq: Two times a day (BID) | OTIC | 0 refills | Status: AC

## 2022-09-07 MED ORDER — IBUPROFEN 800 MG PO TABS
800.0000 mg | ORAL_TABLET | Freq: Once | ORAL | Status: AC
  Administered 2022-09-07: 800 mg via ORAL
  Filled 2022-09-07: qty 1

## 2022-09-07 MED ORDER — OXYCODONE-ACETAMINOPHEN 5-325 MG PO TABS
1.0000 | ORAL_TABLET | Freq: Once | ORAL | Status: AC
  Administered 2022-09-07: 1 via ORAL
  Filled 2022-09-07: qty 1

## 2022-09-07 MED ORDER — OFLOXACIN 0.3 % OP SOLN
5.0000 [drp] | Freq: Every day | OPHTHALMIC | Status: DC
  Administered 2022-09-07: 5 [drp] via OTIC
  Filled 2022-09-07: qty 5

## 2022-09-07 MED ORDER — CEPHALEXIN 500 MG PO CAPS: 500.0000 mg | ORAL_CAPSULE | Freq: Four times a day (QID) | ORAL | 0 refills | Status: AC

## 2022-09-07 NOTE — ED Triage Notes (Signed)
Pt with c/o R ear pain since yesterday morning. States that PTA he "took some kind of steroid and Ibuprofen".

## 2022-09-07 NOTE — ED Notes (Signed)
Pt complains of right ear pain inside and around Pt has experienced yellow tent drainage  Denies n/v and dizziness

## 2022-09-07 NOTE — ED Provider Notes (Signed)
[place] EMERGENCY DEPARTMENT AT [facility] Provider Note   CSN: [id number] Arrival date & time: 09/07/22  0150     History  Chief Complaint  Patient presents with   Wayne Rogers is a 22 y.o. male.  22 year old male who presents to the ER today with right ear pain.  Patient states he has a history of what sounds like mastoiditis status post surgery when he is 22 years old.  Has had a lot of problems since then.  States that the pain started this morning and then got worse again tonight.  States that he tried cleaning out some earwax which helped a little bit.  Has had a little bit of drainage from it when laying on it.  No fevers.  No swelling.  No other changes.  Has not seen the ENT doctor since his mastoiditis.   Otalgia      Home Medications Prior to Admission medications   Medication Sig Start Date End Date Taking? Authorizing Provider  cephALEXin (KEFLEX) 500 MG capsule Take 1 capsule (500 mg total) by mouth 4 (four) times daily. 09/07/22  Yes Taison Celani, Barbara Cower, MD  ofloxacin (FLOXIN) 0.3 % OTIC solution Place 5 drops into the right ear 2 (two) times daily for 10 days. 09/07/22 09/17/22 Yes Haidy Kackley, Barbara Cower, MD  albuterol (PROVENTIL) (2.5 MG/3ML) 0.083% nebulizer solution Take 3 mLs (2.5 mg total) by nebulization every 4 (four) hours as needed for wheezing or shortness of breath. 12/04/15   Alfonse Spruce, MD  albuterol (VENTOLIN HFA) 108 (90 Base) MCG/ACT inhaler INHALE 2 PUFFS EVERY 4 HOURS AS NEEDED FOR WHEEZING OR SHORTNESS OF BREATH. Patient taking differently: Inhale 2 puffs into the lungs every 4 (four) hours as needed for shortness of breath or wheezing. 10/16/17   Alfonse Spruce, MD  albuterol (VENTOLIN HFA) 108 (90 Base) MCG/ACT inhaler Inhale 1-2 puffs into the lungs every 6 (six) hours as needed for wheezing or shortness of breath. 12/30/20   Ward, Tylene Fantasia, PA-C  ondansetron (ZOFRAN-ODT) 4 MG disintegrating tablet Take 1 tablet (4 mg  total) by mouth every 8 (eight) hours as needed for nausea or vomiting. 10/16/21   Particia Nearing, PA-C      Allergies    Penicillins, Sulfa antibiotics, Amoxicillin, Other, and Shellfish allergy    Review of Systems   Review of Systems  HENT:  Positive for ear pain.     Physical Exam Updated Vital Signs BP 127/73 (BP Location: Left Arm)   Pulse 74   Temp 97.9 F (36.6 C) (Oral)   Resp 18   Ht 5\' 6"  (1.676 m)   Wt 77.1 kg   SpO2 97%   BMI 27.44 kg/m  Physical Exam Vitals and nursing note reviewed.  Constitutional:      Appearance: He is well-developed.  HENT:     Head: Normocephalic and atraumatic.     Right Ear: Tympanic membrane normal. Swelling and tenderness present. No mastoid tenderness.     Left Ear: Tympanic membrane normal.     Nose: No congestion or rhinorrhea.     Mouth/Throat:     Mouth: Mucous membranes are moist.     Pharynx: Oropharynx is clear.  Eyes:     Pupils: Pupils are equal, round, and reactive to light.  Cardiovascular:     Rate and Rhythm: Normal rate.  Pulmonary:     Effort: Pulmonary effort is normal. No respiratory distress.  Abdominal:     General:  Abdomen is flat. There is no distension.  Musculoskeletal:        General: Normal range of motion.     Cervical back: Normal range of motion.  Skin:    General: Skin is warm and dry.  Neurological:     General: No focal deficit present.     Mental Status: He is alert.     ED Results / Procedures / Treatments   Labs (all labs ordered are listed, but only abnormal results are displayed) Labs Reviewed - No data to display  EKG None  Radiology No results found.  Procedures Procedures    Medications Ordered in ED Medications  cephALEXin (KEFLEX) capsule 1,000 mg (has no administration in time range)  oxyCODONE-acetaminophen (PERCOCET/ROXICET) 5-325 MG per tablet 1 tablet (has no administration in time range)  ofloxacin (OCUFLOX) 0.3 % ophthalmic solution 5 drop (has no  administration in time range)  ibuprofen (ADVIL) tablet 800 mg (has no administration in time range)    ED Course/ Medical Decision Making/ A&P                                 Medical Decision Making Risk Prescription drug management.   Definitely has otitis externa but obstructed TM with erythema and h/o of AOM with similar symptoms so will treat with ofloxacin and keflex. ENT fu suggested.   Final Clinical Impression(s) / ED Diagnoses Final diagnoses:  Acute otitis externa of right ear, unspecified type    Rx / DC Orders ED Discharge Orders          Ordered    cephALEXin (KEFLEX) 500 MG capsule  4 times daily        09/07/22 0227    ofloxacin (FLOXIN) 0.3 % OTIC solution  2 times daily        09/07/22 0227              Roscoe Witts, Barbara Cower, MD 09/07/22 0231

## 2023-02-12 ENCOUNTER — Encounter (HOSPITAL_COMMUNITY): Payer: Self-pay | Admitting: *Deleted

## 2023-02-12 ENCOUNTER — Other Ambulatory Visit: Payer: Self-pay

## 2023-02-12 ENCOUNTER — Emergency Department (HOSPITAL_COMMUNITY)
Admission: EM | Admit: 2023-02-12 | Discharge: 2023-02-12 | Disposition: A | Discharge status: Home / Self Care | Payer: 59 | Attending: Emergency Medicine | Admitting: Emergency Medicine

## 2023-02-12 DIAGNOSIS — R630 Anorexia: Secondary | ICD-10-CM | POA: Insufficient documentation

## 2023-02-12 DIAGNOSIS — R110 Nausea: Secondary | ICD-10-CM | POA: Insufficient documentation

## 2023-02-12 LAB — URINALYSIS, ROUTINE W REFLEX MICROSCOPIC
Bilirubin Urine: NEGATIVE
Glucose, UA: NEGATIVE mg/dL
Hgb urine dipstick: NEGATIVE
Ketones, ur: NEGATIVE mg/dL
Leukocytes,Ua: NEGATIVE
Nitrite: NEGATIVE
Protein, ur: NEGATIVE mg/dL
Specific Gravity, Urine: 1.014 (ref 1.005–1.030)
pH: 6 (ref 5.0–8.0)

## 2023-02-12 LAB — COMPREHENSIVE METABOLIC PANEL
ALT: 13 U/L (ref 0–44)
AST: 20 U/L (ref 15–41)
Albumin: 4.5 g/dL (ref 3.5–5.0)
Alkaline Phosphatase: 52 U/L (ref 38–126)
Anion gap: 7 (ref 5–15)
BUN: 8 mg/dL (ref 6–20)
CO2: 22 mmol/L (ref 22–32)
Calcium: 9.1 mg/dL (ref 8.9–10.3)
Chloride: 107 mmol/L (ref 98–111)
Creatinine, Ser: 1.02 mg/dL (ref 0.61–1.24)
GFR, Estimated: >60 mL/min (ref >60)
Glucose, Bld: 86 mg/dL (ref 70–99)
Potassium: 3.3 mmol/L — ABNORMAL LOW (ref 3.5–5.1)
Sodium: 136 mmol/L (ref 135–145)
Total Bilirubin: 1.4 mg/dL — ABNORMAL HIGH (ref 0.0–1.2)
Total Protein: 7.6 g/dL (ref 6.5–8.1)

## 2023-02-12 LAB — CBC WITH DIFFERENTIAL/PLATELET
Abs Immature Granulocytes: 0.03 10*3/uL (ref 0.00–0.07)
Basophils Absolute: 0.1 10*3/uL (ref 0.0–0.1)
Basophils Relative: 1 %
Eosinophils Absolute: 0.3 10*3/uL (ref 0.0–0.5)
Eosinophils Relative: 4 %
HCT: 40.4 % (ref 39.0–52.0)
Hemoglobin: 14.6 g/dL (ref 13.0–17.0)
Immature Granulocytes: 0 %
Lymphocytes Relative: 22 %
Lymphs Abs: 1.9 10*3/uL (ref 0.7–4.0)
MCH: 31.6 pg (ref 26.0–34.0)
MCHC: 36.1 g/dL — ABNORMAL HIGH (ref 30.0–36.0)
MCV: 87.4 fL (ref 80.0–100.0)
Monocytes Absolute: 0.8 10*3/uL (ref 0.1–1.0)
Monocytes Relative: 9 %
Neutro Abs: 5.6 10*3/uL (ref 1.7–7.7)
Neutrophils Relative %: 64 %
Platelets: 302 10*3/uL (ref 150–400)
RBC: 4.62 MIL/uL (ref 4.22–5.81)
RDW: 12.6 % (ref 11.5–15.5)
WBC: 8.6 10*3/uL (ref 4.0–10.5)
nRBC: 0 % (ref 0.0–0.2)

## 2023-02-12 LAB — LIPASE, BLOOD: Lipase: 36 U/L (ref 11–51)

## 2023-02-12 MED ORDER — PANTOPRAZOLE SODIUM 40 MG PO TBEC: 40.0000 mg | DELAYED_RELEASE_TABLET | Freq: Every day | ORAL | 0 refills | Status: AC

## 2023-02-12 MED ORDER — ALBUTEROL SULFATE HFA 108 (90 BASE) MCG/ACT IN AERS
2.0000 | INHALATION_SPRAY | Freq: Once | RESPIRATORY_TRACT | Status: AC
  Administered 2023-02-12: 2 via RESPIRATORY_TRACT
  Filled 2023-02-12: qty 6.7

## 2023-02-12 MED ORDER — DICYCLOMINE HCL 10 MG PO CAPS
10.0000 mg | ORAL_CAPSULE | Freq: Once | ORAL | Status: AC
  Administered 2023-02-12: 10 mg via ORAL
  Filled 2023-02-12: qty 1

## 2023-02-12 MED ORDER — ONDANSETRON 8 MG PO TBDP
8.0000 mg | ORAL_TABLET | Freq: Once | ORAL | Status: AC
  Administered 2023-02-12: 8 mg via ORAL
  Filled 2023-02-12: qty 1

## 2023-02-12 MED ORDER — PANTOPRAZOLE SODIUM 40 MG PO TBEC
40.0000 mg | DELAYED_RELEASE_TABLET | Freq: Once | ORAL | Status: AC
  Administered 2023-02-12: 40 mg via ORAL
  Filled 2023-02-12: qty 1

## 2023-02-12 MED ORDER — SUCRALFATE 1 GM/10ML PO SUSP: 1.0000 g | Freq: Three times a day (TID) | ORAL | 0 refills | Status: AC

## 2023-02-12 NOTE — ED Triage Notes (Signed)
 Pt states he has not had much of an appetite for the last 2 weeks  Pt denies any abdominal pain or sick contacts

## 2023-02-12 NOTE — ED Provider Notes (Signed)
 [place] EMERGENCY DEPARTMENT AT [facility] Provider Note   CSN: [id number] Arrival date & time: 02/12/23  0225     History  Chief Complaint  Patient presents with   Anorexia    Wayne Rogers is a 23 y.o. male.  23 year old male without significant past medical history who presents ER today secondary to decreased appetite.  Patient states he is nauseous quite a bit.  Has some abdominal pain especially at night when he lays flat.  Does not drink very often.  Does vape tobacco and marijuana.  Some history of indigestion.  States he thinks he is lost by 11 pounds in the last month.  Does not notice any change in the way his clothes fit.  No fevers.  No other specific pain anywhere.  No history of anything at this before.        Home Medications Prior to Admission medications   Medication Sig Start Date End Date Taking? Authorizing Provider  pantoprazole  (PROTONIX ) 40 MG tablet Take 1 tablet (40 mg total) by mouth daily for 14 days. 02/12/23 02/26/23 Yes Kieli Golladay, Reymundo Caulk, MD  sucralfate  (CARAFATE ) 1 GM/10ML suspension Take 10 mLs (1 g total) by mouth 4 (four) times daily -  with meals and at bedtime. 02/12/23  Yes Lathon Adan, Reymundo Caulk, MD  albuterol  (PROVENTIL ) (2.5 MG/3ML) 0.083% nebulizer solution Take 3 mLs (2.5 mg total) by nebulization every 4 (four) hours as needed for wheezing or shortness of breath. 12/04/15   Rochester Chuck, MD  albuterol  (VENTOLIN  HFA) 108 [phone] Base) MCG/ACT inhaler INHALE 2 PUFFS EVERY 4 HOURS AS NEEDED FOR WHEEZING OR SHORTNESS OF BREATH. Patient taking differently: Inhale 2 puffs into the lungs every 4 (four) hours as needed for shortness of breath or wheezing. 10/16/17   Rochester Chuck, MD  albuterol  (VENTOLIN  HFA) 108 (90 Base) MCG/ACT inhaler Inhale 1-2 puffs into the lungs every 6 (six) hours as needed for wheezing or shortness of breath. 12/30/20   Ward, Char Common, PA-C  cephALEXin  (KEFLEX ) 500 MG capsule Take 1 capsule (500 mg total) by mouth  4 (four) times daily. 09/07/22   Kamerin Axford, Reymundo Caulk, MD  ondansetron  (ZOFRAN -ODT) 4 MG disintegrating tablet Take 1 tablet (4 mg total) by mouth every 8 (eight) hours as needed for nausea or vomiting. 10/16/21   Corbin Dess, PA-C      Allergies    Penicillins, Sulfa antibiotics, Amoxicillin, Other, and Shellfish allergy    Review of Systems   Review of Systems  Physical Exam Updated Vital Signs BP 135/86   Pulse (!) 58   Temp 98.6 F (37 C) (Oral)   Resp 16   Ht 5\' 6"  (1.676 m)   Wt 76.7 kg   SpO2 97%   BMI 27.28 kg/m  Physical Exam Vitals and nursing note reviewed.  Constitutional:      Appearance: He is well-developed.  HENT:     Head: Normocephalic and atraumatic.     Mouth/Throat:     Mouth: Mucous membranes are moist.  Cardiovascular:     Rate and Rhythm: Normal rate.  Pulmonary:     Effort: Pulmonary effort is normal. No respiratory distress.  Abdominal:     General: There is no distension.  Musculoskeletal:        General: Normal range of motion.     Cervical back: Normal range of motion.  Neurological:     Mental Status: He is alert.     ED Results / Procedures / Treatments  Labs (all labs ordered are listed, but only abnormal results are displayed) Labs Reviewed  CBC WITH DIFFERENTIAL/PLATELET - Abnormal; Notable for the following components:      Result Value   MCHC 36.1 (*)    All other components within normal limits  COMPREHENSIVE METABOLIC PANEL - Abnormal; Notable for the following components:   Potassium 3.3 (*)    Total Bilirubin 1.4 (*)    All other components within normal limits  LIPASE, BLOOD  URINALYSIS, ROUTINE W REFLEX MICROSCOPIC    EKG None  Radiology No results found.  Procedures Procedures    Medications Ordered in ED Medications  albuterol  (VENTOLIN  HFA) 108 (90 Base) MCG/ACT inhaler 2 puff (2 puffs Inhalation Given 02/12/23 0537)  ondansetron  (ZOFRAN -ODT) disintegrating tablet 8 mg (8 mg Oral Given 02/12/23  0559)  dicyclomine  (BENTYL ) capsule 10 mg (10 mg Oral Given 02/12/23 0559)  pantoprazole  (PROTONIX ) EC tablet 40 mg (40 mg Oral Given 02/12/23 0559)    ED Course/ Medical Decision Making/ A&P                                 Medical Decision Making Amount and/or Complexity of Data Reviewed Labs: ordered.  Risk Prescription drug management.   Overall patient appears well, well-hydrated not emaciated.  Unclear on the etiology of his symptoms although indigestion is a possibility versus ulcer although the pain does not seem that severe and is abdomen.  No evidence of any kind of malignancy on review of systems.  No focal symptoms to suggest further workup with negative labs and reassuring vital signs.  Does not appear to be dehydrated.  Suggested PCP follow-up for improving symptoms otherwise return here for any new or worsening symptoms.  Work note provided.   Final Clinical Impression(s) / ED Diagnoses Final diagnoses:  Nausea  Decreased appetite    Rx / DC Orders ED Discharge Orders          Ordered    pantoprazole  (PROTONIX ) 40 MG tablet  Daily        02/12/23 0614    sucralfate  (CARAFATE ) 1 GM/10ML suspension  3 times daily with meals & bedtime        02/12/23 0614              Dnaiel Voller, Reymundo Caulk, MD 02/12/23 2310
